# Patient Record
Sex: Female | Born: 1990 | Race: White | Hispanic: No | Marital: Single
Health system: Midwestern US, Academic
[De-identification: ages and names within clinical notes are randomized; demographics above are authoritative.]

## PROBLEM LIST (undated history)

## (undated) HISTORY — PX: WISDOM TOOTH EXTRACTION: SHX21

## (undated) HISTORY — PX: KNEE SURGERY: SHX244

---

## 2011-06-10 ENCOUNTER — Emergency Department (HOSPITAL_COMMUNITY)
Admission: EM | Admit: 2011-06-10 | Discharge: 2011-06-10 | Disposition: A | Discharge status: Home / Self Care | Payer: PRIVATE HEALTH INSURANCE | Attending: Emergency Medicine | Admitting: Emergency Medicine

## 2011-06-10 ENCOUNTER — Emergency Department (HOSPITAL_COMMUNITY): Payer: PRIVATE HEALTH INSURANCE

## 2011-06-10 ENCOUNTER — Encounter (HOSPITAL_COMMUNITY): Payer: Self-pay

## 2011-06-10 DIAGNOSIS — R1013 Epigastric pain: Secondary | ICD-10-CM | POA: Insufficient documentation

## 2011-06-10 DIAGNOSIS — R109 Unspecified abdominal pain: Secondary | ICD-10-CM

## 2011-06-10 DIAGNOSIS — R1011 Right upper quadrant pain: Secondary | ICD-10-CM | POA: Insufficient documentation

## 2011-06-10 LAB — URINALYSIS, ROUTINE W REFLEX MICROSCOPIC
Bilirubin Urine: NEGATIVE
Glucose, UA: NEGATIVE mg/dL
Hgb urine dipstick: NEGATIVE
Leukocytes, UA: NEGATIVE
Nitrite: NEGATIVE
Protein, ur: NEGATIVE mg/dL
Specific Gravity, Urine: 1.012 (ref 1.005–1.030)
Urobilinogen, UA: 0.2 mg/dL (ref 0.0–1.0)
pH: 6 (ref 5.0–8.0)

## 2011-06-10 LAB — PREGNANCY, URINE: Preg Test, Ur: NEGATIVE

## 2011-06-10 LAB — CBC
HCT: 40.6 % (ref 36.0–46.0)
Hemoglobin: 13.6 g/dL (ref 12.0–15.0)
MCH: 28.5 pg (ref 26.0–34.0)
MCHC: 33.5 g/dL (ref 30.0–36.0)
MCV: 84.9 fL (ref 78.0–100.0)
Platelets: 291 K/uL (ref 150–400)
RBC: 4.78 MIL/uL (ref 3.87–5.11)
RDW: 13.8 % (ref 11.5–15.5)
WBC: 7.5 K/uL (ref 4.0–10.5)

## 2011-06-10 LAB — COMPREHENSIVE METABOLIC PANEL
ALT: 11 U/L (ref 0–35)
Alkaline Phosphatase: 77 U/L (ref 39–117)
BUN: 12 mg/dL (ref 6–23)
CO2: 24 mEq/L (ref 19–32)
Chloride: 101 mEq/L (ref 96–112)
GFR calc Af Amer: >90 mL/min (ref >90)
GFR calc non Af Amer: >90 mL/min (ref >90)
Glucose, Bld: 89 mg/dL (ref 70–99)
Potassium: 3.7 mEq/L (ref 3.5–5.1)
Sodium: 136 mEq/L (ref 135–145)
Total Bilirubin: 0.3 mg/dL (ref 0.3–1.2)

## 2011-06-10 LAB — LIPASE, BLOOD: Lipase: 33 U/L (ref 11–59)

## 2011-06-10 MED ORDER — TRAMADOL HCL 50 MG PO TABS: 50.0000 mg | ORAL_TABLET | Freq: Four times a day (QID) | ORAL | Status: AC | PRN

## 2011-06-10 NOTE — Discharge Instructions (Signed)
As we discussed, your ultrasound was normal today. Your labs showed no concerning abnormalities.  [facility] should be able to refer you to an appropriate specialist for continued testing if your symptoms persist. If not, you can call the general surgery group in [place], called [facility] at [phone] to see if they would want to order the HIDA scan we discussed that may be helpful.  If your symptoms get worse/stop going away, are associated with fever, or are associated with vomiting, you should be seen again as this is a sign there may be an infection.     Abdominal Pain Abdominal pain can be caused by many things. Your caregiver decides the seriousness of your pain by an examination and possibly blood tests and X-rays. Many cases can be observed and treated at home. Most abdominal pain is not caused by a disease and will probably improve without treatment. However, in many cases, more time must pass before a clear cause of the pain can be found. Before that point, it may not be known if you need more testing, or if hospitalization or surgery is needed. HOME CARE INSTRUCTIONS   Do not take laxatives unless directed by your caregiver.   Take pain medicine only as directed by your caregiver.   Only take over-the-counter or prescription medicines for pain, discomfort, or fever as directed by your caregiver.   Try a clear liquid diet (broth, tea, or water) for as long as directed by your caregiver. Slowly move to a bland diet as tolerated.  SEEK IMMEDIATE MEDICAL CARE IF:   The pain does not go away.   You have a fever.   You keep throwing up (vomiting).   The pain is felt only in portions of the abdomen. Pain in the right side could possibly be appendicitis. In an adult, pain in the left lower portion of the abdomen could be colitis or diverticulitis.   You pass bloody or black tarry stools.  MAKE SURE YOU:   Understand these instructions.   Will watch your  condition.   Will get help right away if you are not doing well or get worse.  Document Released: 09/28/2004 Document Revised: 12/08/2010 Document Reviewed: 08/07/2007 [facility] Patient Information 2012 [place], [place].

## 2011-06-10 NOTE — ED Notes (Signed)
Pt in from home with abd pain states worsens after eating states pcp recommended her to come here for [place] of gallbladder states some nausea denies vomiting

## 2011-06-10 NOTE — ED Provider Notes (Signed)
History     CSN: [id number]  Arrival date & time 06/10/11  1313   First MD Initiated Contact with Patient 06/10/11 1332      Chief Complaint  Patient presents with  . Abdominal Pain    (Consider location/radiation/quality/duration/timing/severity/associated sxs/prior treatment) The history is provided by the patient.  21 y/o healthy female presents to ED with c/c of intermittent severe sharp non-radiating upper abd pain for the last 3 days. Pain typically begins 15-20 minutes after eating, is assoc with nausea but no vomiting, and resolves spontaneously within 1-2 hours. Pt endorses looser than normal but non-watery, non-bloody stools since the symptoms began. No assoc fever, CP, SOB. Symptoms worse with palpation of the area, going over bumps in the car, no alleviating factors. Was evaluated at [facility] and was advised to come to the ED for [place] evaluation of gallbladder, no tx attempted.  History reviewed. No pertinent past medical history.  History reviewed. No pertinent past surgical history.  No family history on file.  History  Substance Use Topics  . Smoking status: Never Smoker   . Smokeless tobacco: Not on file  . Alcohol Use: Yes     Review of Systems 10 systems reviewed and are negative for acute change except as noted in the HPI.  Allergies  Review of patient's allergies indicates no known allergies.  Home Medications  No current outpatient prescriptions on file.  BP 147/76  Pulse 68  Temp(Src) 97.6 F (36.4 C) (Oral)  Resp 18  SpO2 100%  LMP 05/28/2011  Physical Exam  Nursing note reviewed. Constitutional: She appears well-developed and well-nourished. No distress.       BP 147/76  Pulse 68  Temp(Src) 97.6 F (36.4 C) (Oral)  Resp 18  SpO2 100%  LMP 05/28/2011 VS reviewed and are sig for slight HTN. No tachycardia or fever.  HENT:  Head: Normocephalic and atraumatic.       MMM  Eyes: Conjunctivae are normal.  Neck: Neck supple.    Cardiovascular: Normal rate, regular rhythm and normal heart sounds.        Bilateral radial and DP pulses are 2+   Pulmonary/Chest: Effort normal and breath sounds normal. No respiratory distress. She has no wheezes.  Abdominal: Soft. Bowel sounds are normal. She exhibits no distension. There is tenderness in the right upper quadrant, epigastric area and left upper quadrant. There is no rigidity, no rebound, no guarding and negative Murphy's sign.  Musculoskeletal: She exhibits no edema and no tenderness.  Neurological: She is alert.  Skin: Skin is warm and dry. She is not diaphoretic.  Psychiatric: She has a normal mood and affect.    ED Course  Procedures (including critical care time)  Labs Reviewed  URINALYSIS, ROUTINE W REFLEX MICROSCOPIC - Abnormal; Notable for the following:    Ketones, ur TRACE (*)    All other components within normal limits  CBC  COMPREHENSIVE METABOLIC PANEL  LIPASE, BLOOD  PREGNANCY, URINE   US Abdomen Complete  06/10/2011  *RADIOLOGY REPORT*  Clinical Data:  Right upper quadrant and epigastric pain  ABDOMINAL ULTRASOUND COMPLETE  Comparison:  None.  Findings:  Gallbladder:  No gallstones, gallbladder wall thickening, or pericholecystic fluid.The sonographic Murphy's sign is reportedly negative, by the ultrasound technologist.  Common Bile Duct:  Within normal limits in caliber. Measures 3 mm.  Liver: No focal mass lesion identified.  Within normal limits in parenchymal echogenicity.  IVC:  Appears normal.  Pancreas:  No abnormality identified.  Spleen:  Within normal limits in size and echotexture. Measures 5 cm in length.  Right kidney:  Normal in size and parenchymal echogenicity.  No evidence of mass or hydronephrosis. Measures 12 cm in length.  Left kidney:  Normal in size and parenchymal echogenicity.  No evidence of mass or hydronephrosis. Measures 11.4 cm in length.  Abdominal Aorta:  No aneurysm identified. Maximal anterior to posterior diameter is 1.6  cm.  IMPRESSION: Negative abdominal ultrasound.  Original Report Authenticated By: Britta Mccreedy, M.D.     Dx 1: Abdominal pain   MDM  2:10 PM Pt has been seen and evaluated. Initial history and physical examination complete. No pain at rest on exam, mild upper abd TTP. No fever, emesis, or persistent severe pain- doubt cholecystitis or choledocholithiasis to suggest emergent surgery needed. As pt was advised by an outside provider to have an Korea in ED, we will proceed with this test. Basic abd labs also will be ordered. No signs of dehydration, no pain meds needed- IV not ordered. Will re-eval.      4:21 PM Labs reviewed, trace urinary ketones. Normal abd [place]. Discussed likelihood of biliary colic with pt and family, who voice understanding. Repeat abd exam benign. Will d.c home.  Shaaron Adler, PA-C 06/10/11 1623  Shaaron Adler, PA-C 06/10/11 [phone]

## 2011-06-12 NOTE — ED Provider Notes (Signed)
Medical screening examination/treatment/procedure(s) were performed by non-physician practitioner and as supervising physician I was immediately available for consultation/collaboration.   Gerhard Munch, MD 06/12/11 1239

## 2013-01-30 IMAGING — US US ABDOMEN COMPLETE
1 series · 14 of 25 positions shown · non-contrast
Comparison: None.

CLINICAL DATA: Right upper quadrant and epigastric pain

ABDOMINAL ULTRASOUND COMPLETE

[Series 1: us abdomen complete · 0.26mm/px · 14 of 67 slices shown]
[im 1/67]
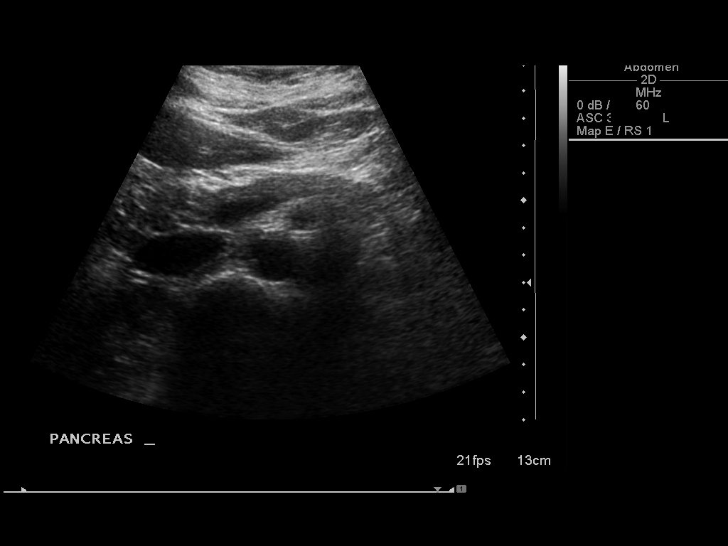
[im 6/67]
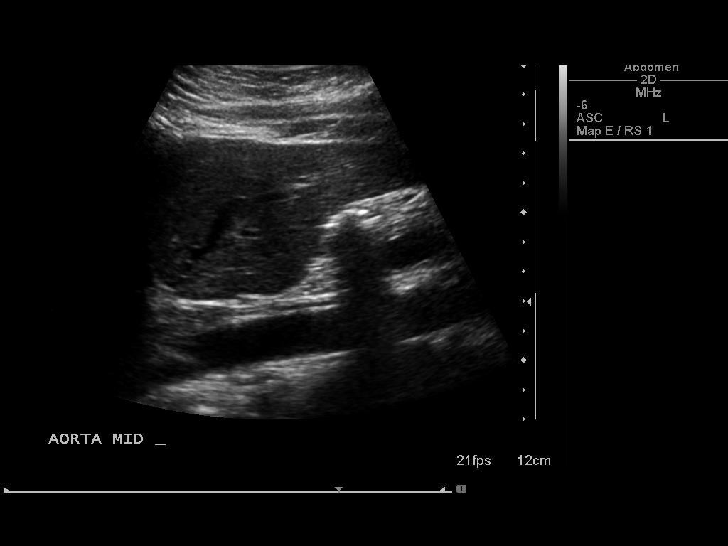
[im 12/67]
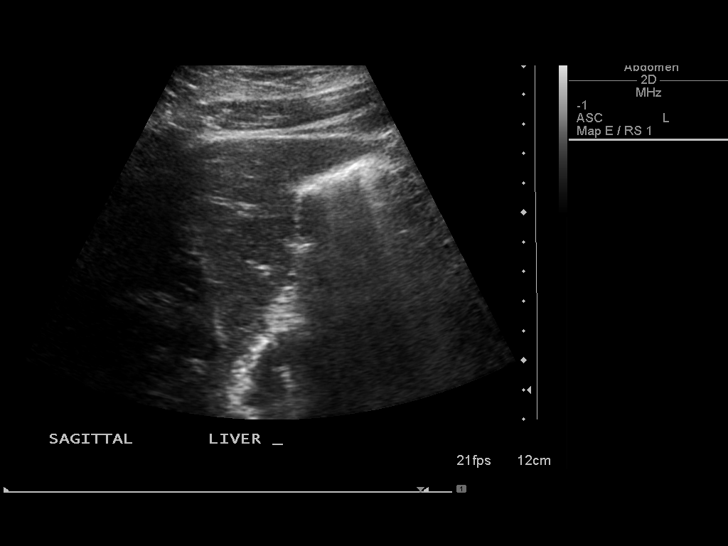
[im 17/67]
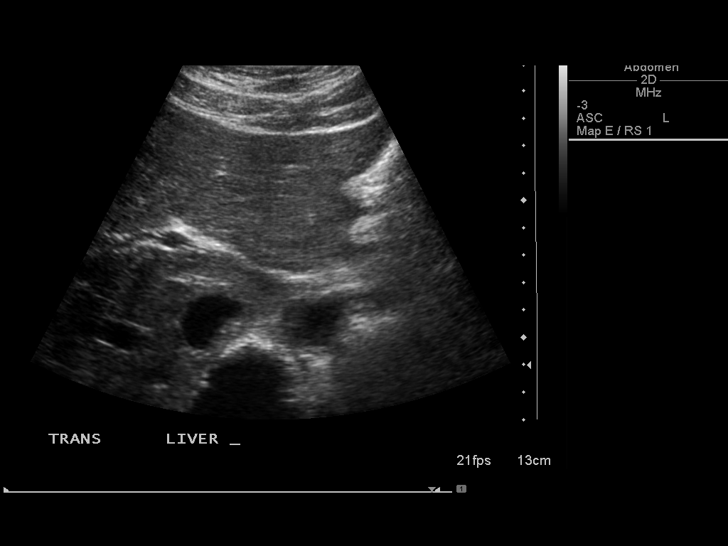
[im 23/67]
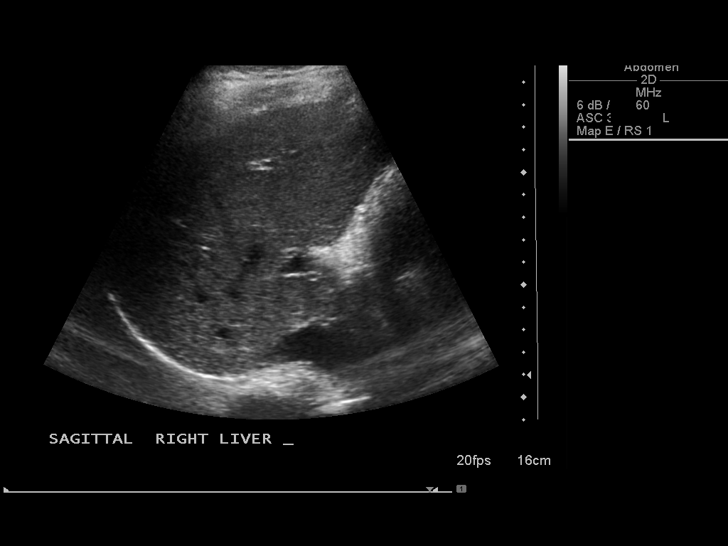
[im 25/67]
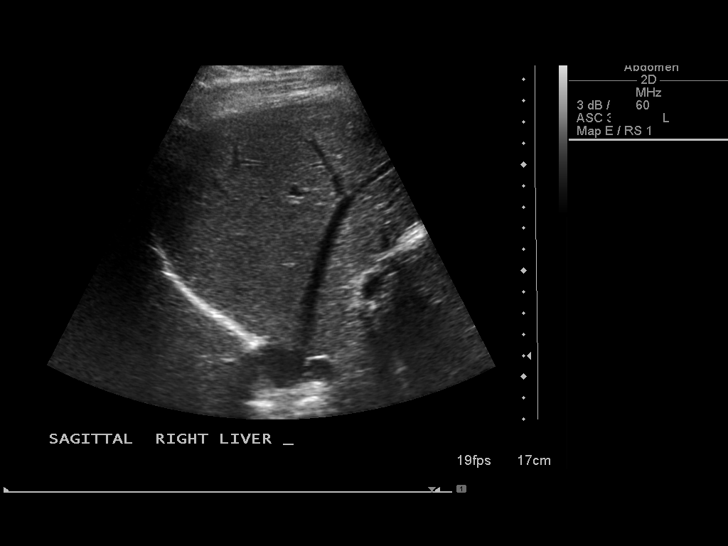
[im 31/67]
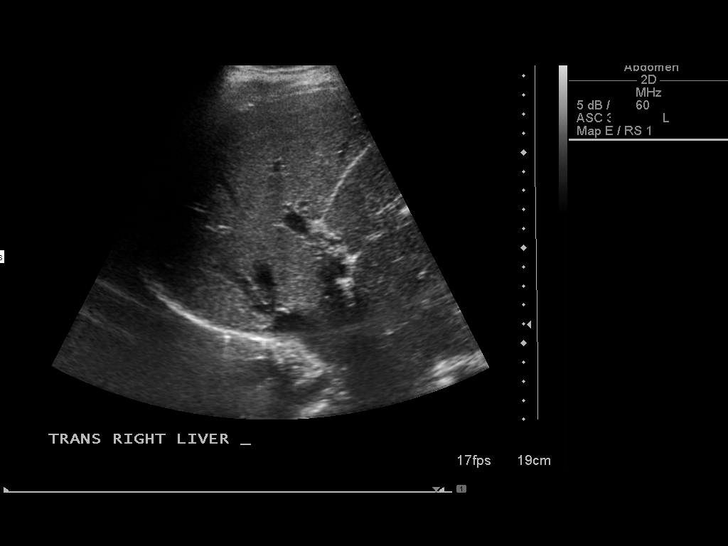
[im 36/67]
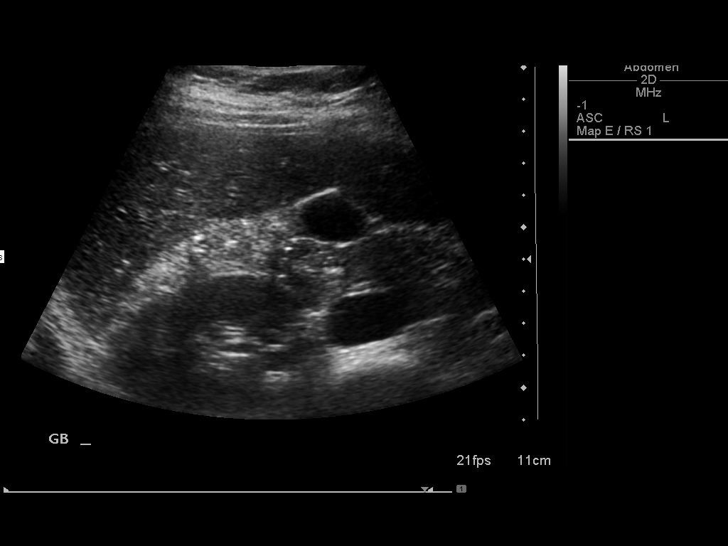
[im 42/67]
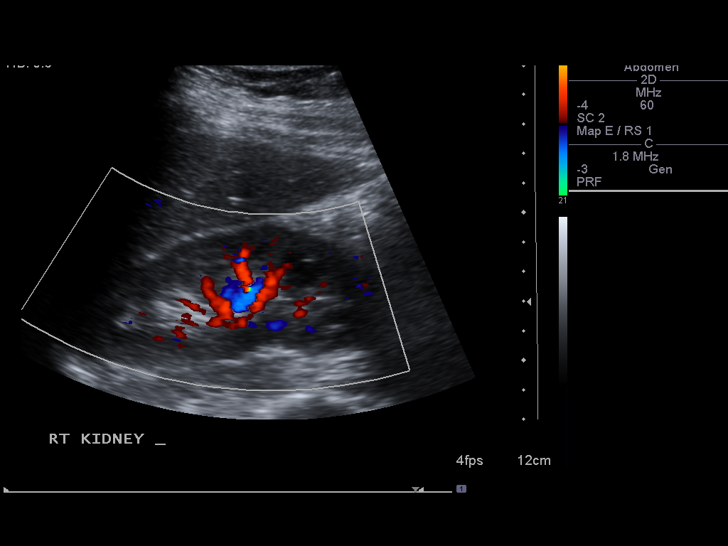
[im 45/67]
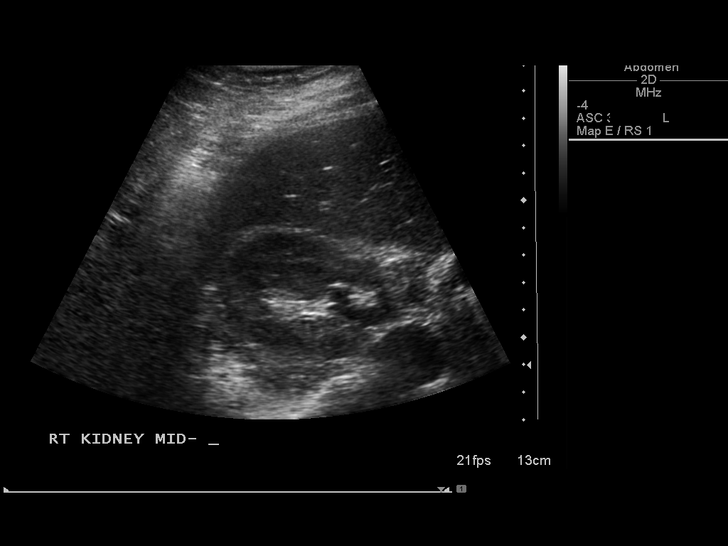
[im 50/67]
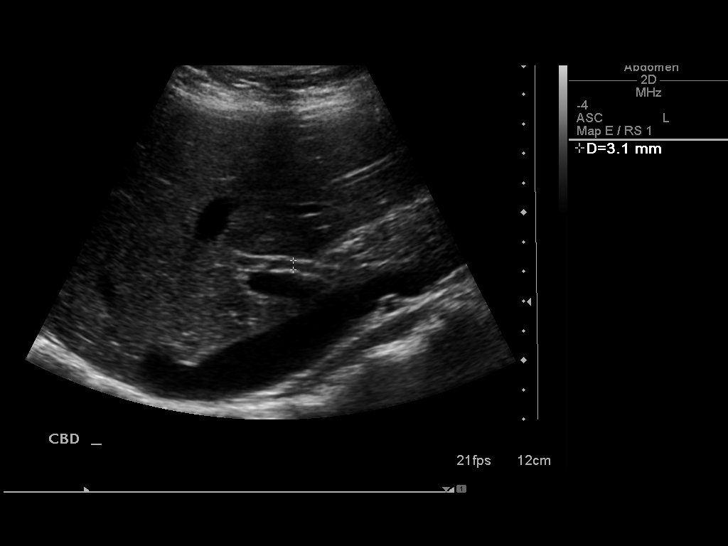
[im 56/67]
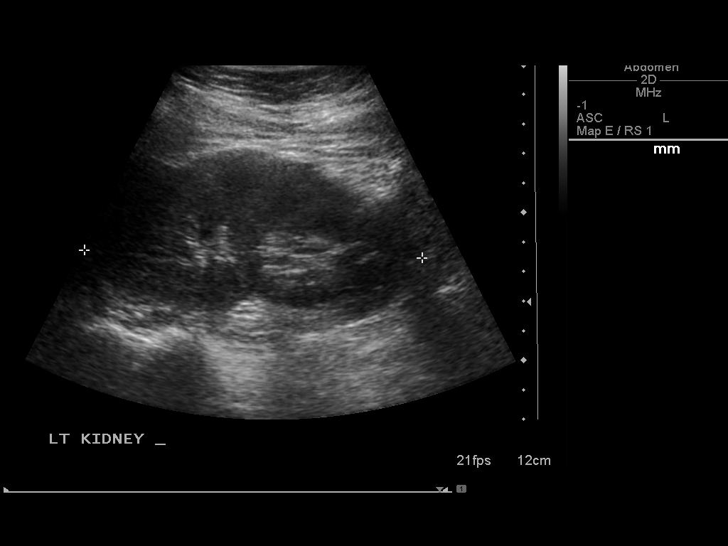
[im 61/67]
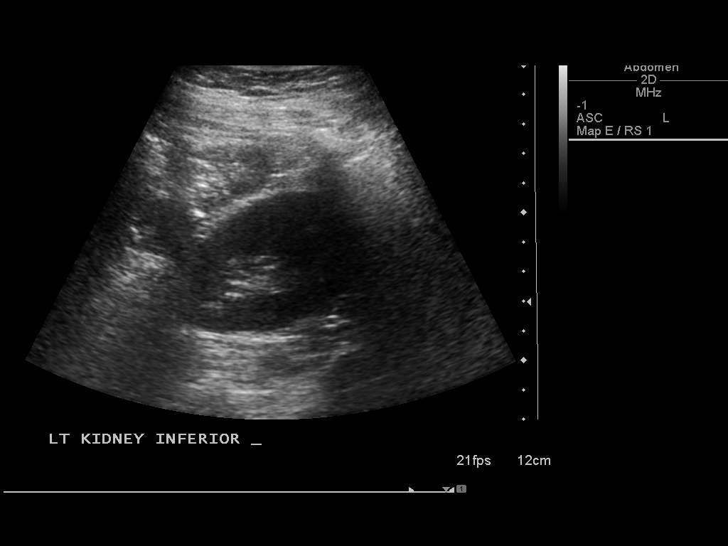
[im 67/67]
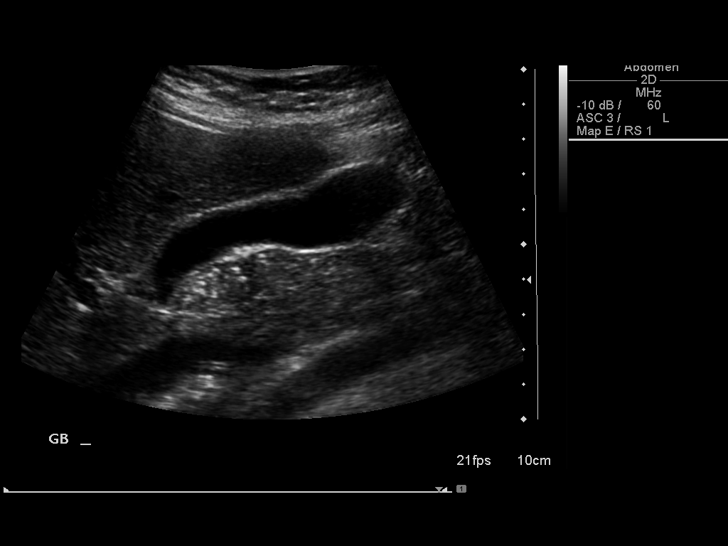

[14 of 25 positions shown; findings below may reference images not displayed]

FINDINGS: Gallbladder:  No gallstones, gallbladder wall thickening, or
pericholecystic fluid.The sonographic Murphy's sign is reportedly
negative, by the ultrasound technologist.

Common Bile Duct:  Within normal limits in caliber. Measures 3 mm.

Liver: No focal mass lesion identified.  Within normal limits in
parenchymal echogenicity.

IVC:  Appears normal.

Pancreas:  No abnormality identified.

Spleen:  Within normal limits in size and echotexture. Measures 5
cm in length.

Right kidney:  Normal in size and parenchymal echogenicity.  No
evidence of mass or hydronephrosis. Measures 12 cm in length.

Left kidney:  Normal in size and parenchymal echogenicity.  No
evidence of mass or hydronephrosis. Measures 11.4 cm in length.

Abdominal Aorta:  No aneurysm identified. Maximal anterior to
posterior diameter is 1.6 cm.
IMPRESSION: Negative abdominal ultrasound.

## 2013-07-16 ENCOUNTER — Emergency Department (HOSPITAL_COMMUNITY)
Admission: EM | Admit: 2013-07-16 | Discharge: 2013-07-16 | Disposition: A | Discharge status: Home / Self Care | Payer: BC Managed Care – PPO | Attending: Emergency Medicine | Admitting: Emergency Medicine

## 2013-07-16 ENCOUNTER — Encounter (HOSPITAL_COMMUNITY): Payer: Self-pay | Admitting: Emergency Medicine

## 2013-07-16 DIAGNOSIS — X58XXXA Exposure to other specified factors, initial encounter: Secondary | ICD-10-CM | POA: Insufficient documentation

## 2013-07-16 DIAGNOSIS — Y9389 Activity, other specified: Secondary | ICD-10-CM | POA: Insufficient documentation

## 2013-07-16 DIAGNOSIS — Y929 Unspecified place or not applicable: Secondary | ICD-10-CM | POA: Insufficient documentation

## 2013-07-16 DIAGNOSIS — S058X9A Other injuries of unspecified eye and orbit, initial encounter: Secondary | ICD-10-CM | POA: Insufficient documentation

## 2013-07-16 DIAGNOSIS — Z79899 Other long term (current) drug therapy: Secondary | ICD-10-CM | POA: Insufficient documentation

## 2013-07-16 DIAGNOSIS — H18821 Corneal disorder due to contact lens, right eye: Secondary | ICD-10-CM

## 2013-07-16 MED ORDER — CIPROFLOXACIN HCL 0.3 % OP SOLN: 2.0000 [drp] | OPHTHALMIC | Status: AC

## 2013-07-16 MED ORDER — IBUPROFEN 200 MG PO TABS
600.0000 mg | ORAL_TABLET | Freq: Once | ORAL | Status: AC
  Administered 2013-07-16: 600 mg via ORAL
  Filled 2013-07-16: qty 3

## 2013-07-16 MED ORDER — IBUPROFEN 800 MG PO TABS: 800.0000 mg | ORAL_TABLET | Freq: Three times a day (TID) | ORAL | Status: AC

## 2013-07-16 NOTE — ED Notes (Signed)
Pt states that after she removed her contacts tonight her eyes felt really dry; pt states that she put some eye drops in her right eye called Sustain Ultra; pt states that her rt eye immediately began to burn; pt states that she looked and the drops expired in 2013; pt c/o redness, burning and swelling to rt eye; pt is concerned about infection from expired eye drops

## 2013-07-16 NOTE — Discharge Instructions (Signed)
Corneal Abrasion °The cornea is the clear covering at the front and center of the eye. When you look at the colored portion of the eye, you are looking through the cornea. It is a thin tissue made up of layers. The top layer is the most sensitive layer. A corneal abrasion happens if this layer is scratched or an injury causes it to come off.  °HOME CARE °· You may be given drops or a medicated cream. Use the medicine as told by your doctor. °· A pressure patch may be put over the eye. If this is done, follow your doctor's instructions for when to remove the patch. Do not drive or use machines while the eye patch is on. Judging distances is hard to do with a patch on. °· See your doctor for a follow-up exam if you are told to do so. It is very important that you keep this appointment. °GET HELP IF:  °· You have pain, are sensitive to light, and have a scratchy feeling in one eye or both eyes. °· Your pressure patch keeps getting loose. You can blink your eye under the patch. °· You have fluid coming from your eye or the lids stick together in the morning. °· You have the same symptoms in the morning that you did with the first abrasion. This could be days, weeks, or months after the first abrasion healed. °MAKE SURE YOU:  °· Understand these instructions. °· Will watch your condition. °· Will get help right away if you are not doing well or get worse. °Document Released: 06/07/2007 Document Revised: 10/09/2012 Document Reviewed: 08/26/2012 °Exit[employer]® Patient Information ©2015 [employer]. This information is not intended to replace advice given to you by your health care provider. Make sure you discuss any questions you have with your health care provider. ° °

## 2013-07-16 NOTE — ED Provider Notes (Signed)
CSN: [id number]     Arrival date & time 07/16/13  0125 History   First MD Initiated Contact with Patient 07/16/13 0203     Chief Complaint  Patient presents with  . Eye Problem     (Consider location/radiation/quality/duration/timing/severity/associated sxs/prior Treatment) Patient is a 23 y.o. female presenting with eye problem. The history is provided by the patient.  Eye Problem Location:  R eye Quality:  Aching Severity:  Moderate Onset quality:  Sudden Timing:  Constant Progression:  Unchanged Chronicity:  New Context: not burn   Relieved by:  Nothing Exacerbated by: expired systane eye drops. Associated symptoms: redness and tearing   Associated symptoms: no double vision and no photophobia   Risk factors: no recent URI     History reviewed. No pertinent past medical history. Past Surgical History  Procedure Laterality Date  . Knee surgery    . Wisdom tooth extraction     No family history on file. History  Substance Use Topics  . Smoking status: Never Smoker   . Smokeless tobacco: Not on file  . Alcohol Use: Yes     Comment: occ   OB History   Grav Para Term Preterm Abortions TAB SAB Ect Mult Living                 Review of Systems  Eyes: Positive for redness. Negative for double vision and photophobia.  All other systems reviewed and are negative.     Allergies  Review of patient's allergies indicates no known allergies.  Home Medications   Prior to Admission medications   Medication Sig Start Date End Date Taking? Authorizing Provider  norgestimate-ethinyl estradiol (ORTHO-CYCLEN,SPRINTEC,PREVIFEM) 0.25-35 MG-MCG tablet Take 1 tablet by mouth daily.   Yes Historical Provider, MD  ciprofloxacin (CILOXAN) 0.3 % ophthalmic solution Place 2 drops into the right eye every 2 (two) hours while awake. Administer 1 drop, every 2 hours, while awake, for 2 days. Then 1 drop, every 4 hours, while awake, for the next 5 days. 07/16/13   Beau Ramsburg K Tyronne Blann-Rasch,  MD  ibuprofen (ADVIL,MOTRIN) 800 MG tablet Take 1 tablet (800 mg total) by mouth 3 (three) times daily. 07/16/13   Thurston Brendlinger K Kendrick Haapala-Rasch, MD   BP 147/88  Pulse 85  Temp(Src) 98.4 F (36.9 C) (Oral)  Resp 20  Ht 5\' 7"  (1.702 m)  Wt 165 lb (74.844 kg)  BMI 25.84 kg/m2  SpO2 100%  LMP 07/12/2013 Physical Exam  Constitutional: She is oriented to person, place, and time. She appears well-developed and well-nourished. No distress.  HENT:  Head: Normocephalic and atraumatic.  Mouth/Throat: Oropharynx is clear and moist.  Eyes: EOM are normal. Pupils are equal, round, and reactive to light. Right eye exhibits no chemosis and no exudate. Left eye exhibits no chemosis and no exudate. Right conjunctiva is injected.  Slit lamp exam:      The right eye shows corneal abrasion.    Neck: Normal range of motion. Neck supple.  Cardiovascular: Normal rate, regular rhythm and intact distal pulses.   Pulmonary/Chest: Effort normal and breath sounds normal. She has no wheezes. She has no rales.  Abdominal: Soft. Bowel sounds are normal. There is no tenderness.  Musculoskeletal: Normal range of motion.  Neurological: She is alert and oriented to person, place, and time.  Skin: Skin is warm and dry.  Psychiatric: She has a normal mood and affect.    ED Course  Procedures (including critical care time) Labs Review Labs Reviewed - No data to display  Imaging Review No results found.   EKG Interpretation None      MDM   Final diagnoses:  Corneal abrasion due to contact lens, right    ciloxan to the right eye.  Disguard contacts.  Glasses only x 7 days follow up with Dr. Randon Goldsmith (who is your regular eye care provider within 48 hours.      Jasmine Awe, MD 07/16/13 [phone]

## 2017-03-05 ENCOUNTER — Encounter

## 2019-12-11 ENCOUNTER — Ambulatory Visit: Admit: 2019-12-11 | Discharge: 2019-12-11 | Payer: PRIVATE HEALTH INSURANCE

## 2019-12-11 DIAGNOSIS — J358 Other chronic diseases of tonsils and adenoids: Secondary | ICD-10-CM

## 2019-12-11 NOTE — Unmapped (Signed)
Tonsil stones are mostly annoying and can cause pain on one or both side. You can try gargling and water pick as well. Options include do nothing or removing the tonsils. You would be a candidate for a tonsillotomy which I do using a laser, think of laser resurfacing.  It removes the crypts but not all the tonsil, the advantage is that it tends not to cause as much pain.  As opposed to the more common tonsillectomy which removes all the tissue and definitely causes a two weeks of a sore throat. Risks are otherwise similar, bleeding occurs in 4% of all tonsillectomy cases.

## 2019-12-11 NOTE — Progress Notes (Signed)
Referring Provider: Pcp, No  Date: 12/11/19      Subjective   History of Present Illness  Patient is a 29 y.o. who present with New Patient Visit/ Consultation and Sore Throat   since 2016 patient has had recurrent bacterial infections roughly every three years. 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 Overall last year was a better year from the infection standpoint however this year she just got another infection over Thanksgiving which required antibiotics and took several days off of school and work. She is here in interested in discussing a tonsillectomy again. 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          Laryngoscopy/Stroboscopy findings: defer    I performed all critical or key portions of the service and discussion of the case with the speech-language pathologist. I was present during the endoscopic examination to review diagnosis with patient and provide assessment and plan for treatment.    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 You can try gargling and water pick as well. Options include do nothing or removing the tonsils. You would be a candidate for a tonsillotomy which I do using a laser, think of laser resurfacing.  It removes the crypts but not all the tonsil, the advantage is that it tends not to cause as much pain.  As opposed to the more common tonsillectomy which removes all the tissue and definitely causes a two weeks of a sore throat. Risks are otherwise similar, bleeding occurs in 4% of all tonsillectomy cases.             Voice Intake Scores  12/11/2019   TOTAL SCORE VHI10: 0   TOTAL SCORE RSI: 0   TOTAL SCORE GFI: 0   TOTAL SCORE EAT10: 0        ENT Voice and Swallow Indexes VHI10 Total Score: (Total score <11 is [facility]) RSI Total Score: (Total score < 13 is WFL)  GFI Total Score: (Total score > 4 reflects problems in vocal function) EAT-10 Total Score: (Total score > 3 indicates difficulty swallowing) Dyspnea Index Total Score:   12/11/2019 0 0 0 0 0       Physical Exam:  Vitals  Pulse 71, resp. rate 16, height 5' 7 (1.702 m), weight 165 lb (74.8 kg), SpO2 99 %.    Constitutional: she is oriented to person, place, and time. she  appears well-developed.     Voice:normal   HENT:   Right Ear: External ear normal.   Left Ear: External ear normal.   OC/OP: Mucosa: normal Teeth:normal Tonsils:tonsils are present and normal  Eyes: Conjunctivae are normal. Pupils are equal, round, and reactive to light.   Neck:full range of motion Incision:absent  Pulmonary/Chest: Effort normal.   Musculoskeletal: Normal range of motion.   Neurological: she is alert and oriented to person, place, and time.   Skin: Skin is warm and dry.   Psychiatric: she has a normal mood and affect.        Cancer Staging  Cancer Staging  No matching staging information was found for the patient.  Histories  She has no past medical history on file.    She has no past surgical history on file.    Her family history is not on file.    She reports that she has never smoked. She has never used smokeless tobacco. She reports current alcohol use of about 3.0 - 4.0 standard drinks of alcohol per week. She reports that she does not use drugs.    Allergies  Patient has no known allergies.    Medications  No outpatient encounter medications on file as of 12/11/2019.     No facility-administered encounter medications on file as of 12/11/2019.        Review of Systems:  * See scanned Review of Systems sheet.    Lab Review:   No results found for: WBC, HGB, HCT, MCV, PLT, CREATININE, BUN, NA, K, CL, CO2, ALT, AST, GGT, ALKPHOS, BILITOT    Risks & Benefits:   Risks, benefits and treatment options discussed  with patient.    Medical Decision Making:  The following items were considered in medical decision making:  Permanent chart problem/surgery list reviewed  Permanent chart chronic medication/allergy list reviewed  Permanent chart social/family history reviewed  Review/order other diagnostic or treatment interventions  Review old records  Independent review of laryngoscopy/stroboscopy  Recommended surgery, discussed risks and benefits    Time spent:>35 Minutes

## 2020-01-22 NOTE — Unmapped (Signed)
Called pt Stephanie [name] advising that her surgery on 1/28 with Dr Providence Lanius time has moved up to 12:20 with her arrival time being 10:20 am

## 2020-01-22 NOTE — Unmapped (Signed)
01/08/20 0001   Pre-op Phone Call   Surgery Time Verified Yes  (01/30/20 @ 1540)   Arrival Time Verified 1340   Surgery Location Verified Yes  [facility])   Remind patient to bring picture ID and insurance card Yes   Medical History Reviewed Yes   NPO Status Reinforced Yes  (NPO 8 hours prior to arrival time)   Ride Caregiver Provider Instructed to arrange   Phone Number for Ride/Caregiver Instructed to arrange   Instructions to bring current medication list Yes   LVMM with Pre-Op instructions for patient. Reviewed fasting guidelines-NPO 8 hours prior to arrival time. Instructed to hold all vitamins and supplements except iron supplements from 01/23/20 until procedure. Avoid NSAIDS including ASA, Advil, Ibuprofen, Aleve, or Naproxen from 01/23/20 until procedure. Currtently not taking any medications according to Epic.  One visitor permitted due to covid.  Phone number given for patient to call with questions or concerns. Instructions also sent through MyChart.

## 2020-01-26 ENCOUNTER — Ambulatory Visit: Admit: 2020-01-26 | Payer: PRIVATE HEALTH INSURANCE

## 2020-01-26 ENCOUNTER — Institutional Professional Consult (permissible substitution): Admit: 2020-01-26 | Discharge: 2020-02-03 | Payer: PRIVATE HEALTH INSURANCE

## 2020-01-26 DIAGNOSIS — J3501 Chronic tonsillitis: Secondary | ICD-10-CM

## 2020-01-26 DIAGNOSIS — Z01818 Encounter for other preprocedural examination: Secondary | ICD-10-CM

## 2020-01-26 LAB — 2019 NOVEL CORONAVIRUS (COVID-19), NAA-B: SARS-CoV-2: NOT DETECTED

## 2020-01-26 NOTE — Unmapped (Signed)
Covid-19 nasopharyngeal specimen collected.

## 2020-01-29 NOTE — Unmapped (Signed)
Left message on voicemail to update arrival/OR time 0930/1130. Call back number left for questions or concerns.

## 2020-01-30 MED ORDER — midazolam (PF) (VERSED) injection
1 | INTRAMUSCULAR | Status: AC | PRN
Start: 2020-01-30 — End: 2020-01-30
  Administered 2020-01-30: 20:00:00 2 via INTRAVENOUS

## 2020-01-30 MED ORDER — HYDROmorphone (DILAUDID) 1 mg/mL injection Syrg
1 | INTRAMUSCULAR | Status: AC
Start: 2020-01-30 — End: ?

## 2020-01-30 MED ORDER — sodium chloride 0.9 % irrigation
0.9 | Status: AC | PRN
Start: 2020-01-30 — End: 2020-01-30
  Administered 2020-01-30: 20:00:00 500

## 2020-01-30 MED ORDER — acetaminophen (TYLENOL) tablet 975 mg
325 | ORAL | Status: AC | PRN
Start: 2020-01-30 — End: 2020-01-30
  Administered 2020-01-30: 15:00:00 975 mg via ORAL

## 2020-01-30 MED ORDER — fentaNYL (SUBLIMAZE) injection 12.5 mcg
50 | INTRAMUSCULAR | Status: AC | PRN
Start: 2020-01-30 — End: 2020-01-30

## 2020-01-30 MED ORDER — dexmedeTOMIDine (PRECEDEX) 100 mcg/mL injection
100 | INTRAVENOUS | Status: AC
Start: 2020-01-30 — End: ?

## 2020-01-30 MED ORDER — methylPREDNISolone (MEDROL DOSEPACK) 4 mg tablet
4 | ORAL | 0 refills | Status: AC
Start: 2020-01-30 — End: ?

## 2020-01-30 MED ORDER — HYDROmorphone (DILAUDID) injection Syrg 0.2 mg
0.5 | INTRAMUSCULAR | Status: AC | PRN
Start: 2020-01-30 — End: 2020-01-30

## 2020-01-30 MED ORDER — ondansetron (ZOFRAN) injection
4 | INTRAMUSCULAR | Status: AC | PRN
Start: 2020-01-30 — End: 2020-01-30
  Administered 2020-01-30: 20:00:00 4 via INTRAVENOUS

## 2020-01-30 MED ORDER — fentaNYL (SUBLIMAZE) injection 25 mcg
50 | INTRAMUSCULAR | Status: AC | PRN
Start: 2020-01-30 — End: 2020-01-30
  Administered 2020-01-30 (×2): 25 ug via INTRAVENOUS

## 2020-01-30 MED ORDER — lactated Ringers infusion
INTRAVENOUS | Status: AC
Start: 2020-01-30 — End: 2020-01-30
  Administered 2020-01-30 (×2): via INTRAVENOUS

## 2020-01-30 MED ORDER — HYDROmorphone (DILAUDID) injection Syrg
1 | INTRAMUSCULAR | Status: AC | PRN
Start: 2020-01-30 — End: 2020-01-30
  Administered 2020-01-30 (×2): .5 via INTRAVENOUS

## 2020-01-30 MED ORDER — oxyCODONE (ROXICODONE) 5 MG immediate release tablet
5 | ORAL_TABLET | Freq: Four times a day (QID) | ORAL | 0 refills | 6.00000 days | Status: AC | PRN
Start: 2020-01-30 — End: 2020-02-04

## 2020-01-30 MED ORDER — glucose chewable tablet 12 g
4 | ORAL | Status: AC | PRN
Start: 2020-01-30 — End: 2020-01-30

## 2020-01-30 MED ORDER — ibuprofen (MOTRIN) 200 MG tablet
200 | ORAL_TABLET | Freq: Three times a day (TID) | ORAL | 1 refills | Status: AC
Start: 2020-01-30 — End: ?

## 2020-01-30 MED ORDER — senna-docusate (SENNA-S) 8.6-50 mg per tablet
8.6-50 | ORAL_TABLET | Freq: Two times a day (BID) | ORAL | 0 refills | Status: AC | PRN
Start: 2020-01-30 — End: ?

## 2020-01-30 MED ORDER — fentaNYL (SUBLIMAZE) injection
50 | INTRAMUSCULAR | Status: AC | PRN
Start: 2020-01-30 — End: 2020-01-30
  Administered 2020-01-30 (×3): 25 via INTRAVENOUS

## 2020-01-30 MED ORDER — dexamethasone (DECADRON) injection 4 mg
4 | Freq: Once | INTRAMUSCULAR | Status: AC | PRN
Start: 2020-01-30 — End: 2020-01-30

## 2020-01-30 MED ORDER — acetaminophen (TYLENOL) 500 MG tablet
500 | ORAL_TABLET | Freq: Three times a day (TID) | ORAL | 1 refills | 11.00000 days | Status: AC
Start: 2020-01-30 — End: ?

## 2020-01-30 MED ORDER — glycopyrrolate (ROBINUL) injection
0.2 | INTRAMUSCULAR | Status: AC | PRN
Start: 2020-01-30 — End: 2020-01-30
  Administered 2020-01-30: 20:00:00 .2 via INTRAVENOUS

## 2020-01-30 MED ORDER — oxyCODONE (ROXICODONE) immediate release tablet 2.5 mg
5 | ORAL | Status: AC | PRN
Start: 2020-01-30 — End: 2020-01-30

## 2020-01-30 MED ORDER — HYDROmorphone (DILAUDID) injection Syrg 0.5 mg
0.5 | INTRAMUSCULAR | Status: AC | PRN
Start: 2020-01-30 — End: 2020-01-30

## 2020-01-30 MED ORDER — fentaNYL (SUBLIMAZE) 50 mcg/mL injection
50 | INTRAMUSCULAR | Status: AC
Start: 2020-01-30 — End: ?

## 2020-01-30 MED ORDER — dexmedeTOMIDine (PRECEDEX) 4 mcg/mL in sodium chloride 0.9 % 50 mL infusion
100 | INTRAVENOUS | Status: AC | PRN
Start: 2020-01-30 — End: 2020-01-30
  Administered 2020-01-30: 20:00:00 8 via INTRAVENOUS
  Administered 2020-01-30: 21:00:00 4 via INTRAVENOUS
  Administered 2020-01-30: 21:00:00 12 via INTRAVENOUS
  Administered 2020-01-30: 21:00:00 8 via INTRAVENOUS
  Administered 2020-01-30: 21:00:00 4 via INTRAVENOUS
  Administered 2020-01-30 (×2): 8 via INTRAVENOUS
  Administered 2020-01-30: 20:00:00 4 via INTRAVENOUS

## 2020-01-30 MED ORDER — sugammadex (BRIDION) IV solution
100 | INTRAVENOUS | Status: AC | PRN
Start: 2020-01-30 — End: 2020-01-30
  Administered 2020-01-30: 21:00:00 200 via INTRAVENOUS

## 2020-01-30 MED ORDER — propofol 10 mg/ml, 20mL vial (DIPRIVAN) INFUSION
10 | INTRAVENOUS | Status: AC | PRN
Start: 2020-01-30 — End: 2020-01-30
  Administered 2020-01-30: 20:00:00 150 via INTRAVENOUS

## 2020-01-30 MED ORDER — rocuronium (ZEMURON) injection
10 | INTRAVENOUS | Status: AC | PRN
Start: 2020-01-30 — End: 2020-01-30
  Administered 2020-01-30: 20:00:00 40 via INTRAVENOUS

## 2020-01-30 MED ORDER — ondansetron (ZOFRAN) injection 4 mg
4 | Freq: Three times a day (TID) | INTRAMUSCULAR | Status: AC | PRN
Start: 2020-01-30 — End: 2020-01-30

## 2020-01-30 MED ORDER — dexamethasone (DECADRON) injection
4 | INTRAMUSCULAR | Status: AC | PRN
Start: 2020-01-30 — End: 2020-01-30
  Administered 2020-01-30: 20:00:00 10 via INTRAVENOUS

## 2020-01-30 MED ORDER — dextrose 50 % in water (D50W) iv Syrg 25 mL
INTRAVENOUS | Status: AC | PRN
Start: 2020-01-30 — End: 2020-01-30

## 2020-01-30 MED ORDER — EPINEPHrine (ADRENALIN) 1 mg/mL injection
1 | INTRAMUSCULAR | Status: AC | PRN
Start: 2020-01-30 — End: 2020-01-30
  Administered 2020-01-30: 20:00:00 5 via SUBCUTANEOUS

## 2020-01-30 MED ORDER — propofol 10 mg/ml (DIPRIVAN) injection
10 | INTRAVENOUS | Status: AC | PRN
Start: 2020-01-30 — End: 2020-01-30
  Administered 2020-01-30: 20:00:00 200 via INTRAVENOUS
  Administered 2020-01-30: 21:00:00 50 via INTRAVENOUS

## 2020-01-30 MED ORDER — naloxone (NARCAN) injection 0.04 mg
0.4 | INTRAMUSCULAR | Status: AC | PRN
Start: 2020-01-30 — End: 2020-01-30

## 2020-01-30 MED ORDER — glycopyrrolate (ROBINUL) 0.2 mg/mL injection
0.2 | INTRAMUSCULAR | Status: AC
Start: 2020-01-30 — End: ?

## 2020-01-30 MED ORDER — dextrose 50 % in water (D50W) iv Syrg 50 mL
INTRAVENOUS | Status: AC | PRN
Start: 2020-01-30 — End: 2020-01-30

## 2020-01-30 MED ORDER — peppermint oiL liquid 1 mL
Status: AC | PRN
Start: 2020-01-30 — End: 2020-01-30

## 2020-01-30 MED ORDER — proMETHazine (PHENERGAN) injection 6.25 mg
25 | Freq: Four times a day (QID) | INTRAMUSCULAR | Status: AC | PRN
Start: 2020-01-30 — End: 2020-01-30

## 2020-01-30 MED ORDER — lidocainePF220mgmLSoln
20 | INTRAMUSCULAR | Status: AC | PRN
Start: 2020-01-30 — End: 2020-01-30
  Administered 2020-01-30: 20:00:00 80 via INTRAVENOUS

## 2020-01-30 MED ORDER — oxyCODONE (ROXICODONE) immediate release tablet 5 mg
5 | ORAL | Status: AC | PRN
Start: 2020-01-30 — End: 2020-01-30
  Administered 2020-01-30: 22:00:00 5 mg via ORAL

## 2020-01-30 MED ORDER — midazolam (PF) (VERSED) 1 mg/mL injection
1 | INTRAMUSCULAR | Status: AC
Start: 2020-01-30 — End: ?

## 2020-01-30 MED FILL — OXYCODONE 5 MG TABLET: 5 5 MG | ORAL | Qty: 1

## 2020-01-30 MED FILL — GLYCOPYRROLATE 0.2 MG/ML INJECTION SOLUTION: 0.2 0.2 mg/mL | INTRAMUSCULAR | Qty: 2

## 2020-01-30 MED FILL — DEXMEDETOMIDINE 100 MCG/ML INTRAVENOUS SOLUTION: 100 100 mcg/mL | INTRAVENOUS | Qty: 2

## 2020-01-30 MED FILL — HYDROMORPHONE 1 MG/ML INJECTION SYRINGE: 1 1 mg/mL | INTRAMUSCULAR | Qty: 1

## 2020-01-30 MED FILL — FENTANYL (PF) 50 MCG/ML INJECTION SOLUTION: 50 50 mcg/mL | INTRAMUSCULAR | Qty: 2

## 2020-01-30 MED FILL — TYLENOL 325 MG TABLET: 325 325 mg | ORAL | Qty: 3

## 2020-01-30 MED FILL — MIDAZOLAM (PF) 1 MG/ML INJECTION SOLUTION: 1 1 mg/mL | INTRAMUSCULAR | Qty: 2

## 2020-01-30 NOTE — Unmapped (Addendum)
[place]  DEPARTMENT OF ANESTHESIOLOGY  PRE-PROCEDURAL EVALUATION    Stephanie [name] is a 30 y.o. year old female presenting for:    Procedure(s):  MICROLARYNGOSCOPY with laser excision  Tonsillectomy ( greater than/equal 30 yo) consent laser tonsillotomy    Surgeon:   Lora Havens, MD    Chief Complaint     Chronic tonsillitis [J35.01]    Review of Systems     Anesthesia Evaluation    Patient summary reviewed and nursing notes reviewed.       No history of anesthetic complications   I have reviewed the History and Physical Exam, any relevant changes are noted in the anesthesia pre-operative evaluation.      Cardiovascular:    Exercise tolerance:  Duke Met score: 8 - Moving heavy furniture. Rapidly climbing stairs. Carrying 20 pounds up stairs.  (-) hypertension, past MI, cardiomyopathy, angina.    Neuro/Muscoloskeletal/Psych:      (-) seizures, neuromuscular disease, back problems, no anxiety, no bipolar disorder, no depression.     Pulmonary:      (-) asthma, recent URI, sleep apnea.       GI/Hepatic/Renal:      No bowel prep.  (-) GERD, hepatitis, liver disease, renal disease.    Endo/Other:        (-) hypothyroidism, no anemia, no bleeding disorder, no DVT, no steroid use.     Comments: Overweight BMI 27  Chronic tonsillitis No opiate use      Past Medical History     History reviewed. No pertinent past medical history.    Past Surgical History     Past Surgical History:   Procedure Laterality Date   ??? KNEE SURGERY     ??? WISDOM TOOTH EXTRACTION         Family History     History reviewed. No pertinent family history.    Social History     Social History     Socioeconomic History   ??? Marital status: Single     Spouse name: Not on file   ??? Number of children: Not on file   ??? Years of education: Not on file   ??? Highest education level: Not on file   Occupational History   ??? Not on file   Tobacco Use   ??? Smoking status: Never Smoker   ??? Smokeless tobacco: Never Used   Vaping Use   ??? Vaping Use: Never used    Substance and Sexual Activity   ??? Alcohol use: Yes     Alcohol/week: 3.0 - 4.0 standard drinks     Types: 3 - 4 Standard drinks or equivalent per week   ??? Drug use: Yes     Types: Marijuana     Comment: edibles on occassion   ??? Sexual activity: Not on file   Other Topics Concern   ??? Not on file   Social History Narrative   ??? Not on file     Social Determinants of Health     Financial Resource Strain: Not on file   Physical Activity: Not on file   Stress: Not on file   Social Connections: Not on file   Housing Stability: Not on file       Medications     Allergies:  No Known Allergies    Home Meds:  Prior to Admission medications as of 01/30/20 1016   Not on File       Inpatient Meds:  Scheduled:   Continuous:   ???  lactated Ringers         PRN: dextrose 50 % in water (D50W) **OR** dextrose 50 % in water (D50W), glucose    Vital Signs     Wt Readings from Last 3 Encounters:   01/30/20 175 lb (79.4 kg)   12/11/19 165 lb (74.8 kg)     Ht Readings from Last 3 Encounters:   01/30/20 5' 7 (1.702 m)   12/11/19 5' 7 (1.702 m)     Temp Readings from Last 3 Encounters:   01/30/20 98.5 ??F (36.9 ??C) (Temporal)     BP Readings from Last 3 Encounters:   01/30/20 141/74     Pulse Readings from Last 3 Encounters:   01/30/20 83   12/11/19 71     @LASTSAO2 (3)@    Physical Exam     Airway:     Mallampati: I  Mouth Opening: >2 FB  TM distance: > = 3 FB  Neck ROM: full    Dental:   - No obvious cracked, loose, chipped, or missing teeth.     Pulmonary:        (-) no rhonchi and no wheezes.    Cardiovascular:     Rhythm: regular  Rate: normal  (-) murmur and peripheral edema.    Neuro/Musculoskeletal/Psych:    Mental status: alert and oriented to person, place and time.          Abdominal:       Current OB Status:       Other Findings:        Laboratory Data     No results found for: WBC, HGB, HCT, MCV, PLT    No results found for: ABORH    No results found for: GLUCOSE, BUN, CO2, CREATININE, K, NA, CL, CALCIUM, ALBUMIN, PROT, ALKPHOS,  ALT, AST, BILITOT    No results found for: PTT, INR    No results found for: PREGTESTUR, PREGSERUM, HCG, HCGQUANT    Anesthesia Plan     ASA 2         Female and current non-smoker    Anesthesia Type:  general endotracheal.      PONV Risk Factors: female, current non-smoker,                  (PIV, PACU postop.  Airway per ENT.  IV opioids and tylenol for postop pain control.  )  Anesthetic plan and risks discussed with patient.    Plan, alternatives, and risks of anesthesia, including death, have been explained to and discussed with the patient/legal guardian.  By my assessment, the patient/legal guardian understands and agrees.  Scenario presented in detail.  Questions answered.    Use of blood products discussed with patient who consented to blood products.       Plan discussed with CRNA, attending and RNSA.

## 2020-01-30 NOTE — Unmapped (Signed)
Anesthesia Extubation Criteria:    Airway Device: endotracheal tube    Emergence Details:      Smooth      _x_      Stormy       __       Prolonged   __     Extubation Criteria:      Motor strength intact       _x_      Follows commands        _x_      Good airway reflexes      _x_      OP suctioned                  _x_        Follows commands:  Yes     Patient extubated:  Yes

## 2020-01-30 NOTE — Unmapped (Signed)
MICROLARYNGOSCOPY, laser excision Tonsillectomy ( greater than/equal 30 yo)  Brief Op Note  Stephanie [name]  01/30/2020      Pre-op Diagnosis: Chronic tonsillitis [J35.01]       Post-op Diagnosis: same    Procedure(s):  MICROLARYNGOSCOPY  laser excision Tonsillectomy ( greater than/equal 30 yo)      Surgeon(s):  Lora Havens, MD    Anesthesia: General    Staff:   Circulator: Rance Muir, RN; Galen Manila, RN  Scrub Person: Ok Anis, CST; Imagene Riches, CST  Resident: Hinton Rao, MD    Estimated Blood Loss: Minimal, less than 5cc                     There were no complications unless listed below.        Stephanie [name]     Date: 01/30/2020  Time: 3:47 PM

## 2020-01-30 NOTE — Unmapped (Signed)
Anesthesia Post Note    Patient: Stephanie [name]    Procedure(s) Performed: Procedure(s):  MICROLARYNGOSCOPY  laser excision Tonsillectomy ( greater than/equal 30 yo)    Anesthesia type: general endotracheal    Patient location: PACU    Airway: Patent    Post pain: Adequate analgesia    Nausea / Vomiting: Absent    Post-operative Hydration Status: Adequate    Post assessment: no apparent anesthetic complications and tolerated procedure well    Last Vitals:   Vitals:    01/30/20 1024 01/30/20 1602   BP: 141/74 106/45   BP Location:  Left arm   Patient Position:  Lying   Pulse: 83 73   Resp: 16 13   Temp: 98.5 ??F (36.9 ??C) 97.1 ??F (36.2 ??C)   TempSrc: Temporal Temporal   SpO2: 100% 98%   Weight: 175 lb (79.4 kg)    Height: 5' 7 (1.702 m)         Post vital signs: stable    Level of consciousness: awake    Complications:  There were no known complications for this encounter.

## 2020-01-30 NOTE — Unmapped (Signed)
INTRA-OP POST BRIEFING NOTE: Stephanie [name]      Specimens:     Prior to leaving the room: Nurse confirmed name of procedure, completion of instrument, sponge & needle counts, reads specimen labels aloud including patient name and addresses any equipment issues? Nurse confirmed wound class. Nurse to surgeon and anesthesia: What are key concerns for recovery and management of the patient?  Yes      Blood products stored at appropriate temperatures prior to return to blood bank (if applicable)? N/A      Patient identification band secured on patient prior to transfer out of the operating room? Yes    Temporary devices implanted for the duration of the surgery removed and evaluated for intactness and completeness prior to closure? N/A      Other Comments:     Signed: Rance Muir    Date: 01/30/2020    Time: 4:05 PM

## 2020-01-30 NOTE — Unmapped (Signed)
1. Activity:  ?? No lifting more than 5-10lbs or strenuous activity for 1-2 weeks  ?? May return to work as tolerated  2. Diet:  ?? Start with soft foods and advance to regular diet as tolerated.   3. Incision/wound care:   ?? Patient may shower/take a bath,  ?? You will have bad breath for up to 2 weeks.   4. Pain Management: No driving while taking narcotic pain medications. Take pain medications as directed or take ibuprofen or tylenol as instructed on package. Take the medrol dose pack as instructed starting the day after surgery.  5. Follow up:  Follow up as listed below or call [phone] to make an appointment.   6. Other instructions:     Please go to the Emergency Room or call our clinic if you experience any of the following:   ?? worsening pain, nausea, or vomiting not relieved by medications.   ?? Temperature greater than 102 F.   ?? Oral bleeding, if minimal (< 1 tsp) call office, for >1tsbp of blood go directly to ER.   ?? Chest pain, shortness of breath, persistent dizziness, swelling in one or both legs    Discharge Medications:     Medication List      TAKE these medications      PRN MORN NOON EVE BED   acetaminophen 500 MG tablet  Commonly known as: TYLENOL  Take 2 tablets (1,000 mg total) by mouth every 8 hours.                    ibuprofen 200 MG tablet  Commonly known as: MOTRIN  Take 2 tablets (400 mg total) by mouth every 8 hours.                    methylPREDNISolone 4 mg tablet  Commonly known as: MEDROL DOSEPACK  follow package directions                    oxyCODONE 5 MG immediate release tablet  Commonly known as: ROXICODONE  Take 1 tablet (5 mg total) by mouth every 6 hours as needed for Pain for up to 5 days.                    senna-docusate 8.6-50 mg per tablet  Commonly known as: SENNA-S  Take 1 tablet by mouth every 12 hours as needed for Constipation.                           Post Discharge Medical Supplies and Care Needs:     Appointments:  No follow-up provider specified.    Patient will  have follow up appointment in ENT clinic.    ?? Clinic is located at [facility] (4th floor), [address] [place], [place] [address]    Our clinic number is: [phone].  Please call with any questions or concerns.   Future Appointments   Date Time Provider Department Center   03/02/2020  3:00 PM Lora Havens, MD [facility]       Other Future Appointments (may need to be scheduled):        Additional Follow Up Actions:   PCP  Tora Kindred, MD  [address] [place] [phone]  [phone]

## 2020-01-30 NOTE — Unmapped (Signed)
Pt arrived to SDS from PACU. See flowsheet for assessment and reassessment.    Pt meets discharge criteria. Pt discharge instructions provided to pt and pt's mother, bedside. Prescriptions x 5 discussed, and pt to pick up from personal pharmacy. Pt and pt's mom with verbalized questions, questions answered per satisfaction. IV d/c'd. Pt and pt's mom left unit without incident.

## 2020-01-30 NOTE — Unmapped (Signed)
01/30/2020     Pre-Operative Diagnosis: chronic tonsillitis    Post-Operative Diagnosis: same    Procedure: CO2 laser excision bilateral tonsillectomy and suspension microlaryngoscopy [id number]    Attending: Lora Havens, MD    Assistant: ENT Resident: Talat    Anesthesia: general, LTA: 4% topical lidocaine    Indications: Stephanie [name] is a 30 y.o. patient with  I have recommended a CO2 laser tonsillotomy. ??We discussed the risks and benefits of surgery and the alternative which is to do nothing. ??Particular risks are sore gums, throat, oral bleeding up to 4% over two weeks, chipped teeth and also need for revision or no improvement at all in the symptoms. The patient has agreed to the above procedure and wishes to proceed with scheduling. ????    Procedure: Patient was brought into the operating room. Next, a surgical timeout was taken and the patient was identified by name, medical record number, date of birth, and procedure to be undertaken. The patient was secured to the operating room table, the right arm was secured with foam protecting the lateral arm. After a short amount of time, general anesthesia was induced. Next, with a maxillary dental guard in place a 4% laryngotracheal anesthesthetic was applied topically and with a 5-0 Rusch Laser ETT the patient was intubated by Dr Providence Lanius using the [facility] and suspension microlaryngoscopy with normal folds. ??    Laser safety precautions were taken-anesthesia decreased the inspired FiO2 < 30%, moist towels were used to protect the patient's face, all OR personnel wore laser-specific safety goggles and a laser certified technician was present throughout the procedure. ??     Next, the crowe davis mouth gag was placed into the oral cavity effectively suspending the oral tongue and floor of mouth.?? Next the Luminis CO2 laser and pharyngeal handpiece were used at 18W and circle of 1.5 depth of 3, repeat delay 0.1 to ablate the bilateral tonsils, leaving a capsule intact.?? There  was minimal bleeding easily controlled.???? The oral cavity was irrigated with sterile saline and an OG passed into the stomach to remove any residual stomach contents.    Finally, the patient was taken out of suspension, suctioned, returned to the anesthesia team, extubated, and finally returned to PACU in stable condition.??    Findings:??  1. [name] 1 exposure 5-0Rusch ETT??  2. Accublade CO2 laser tonsillotomy bilateral; no specimens

## 2020-01-30 NOTE — Unmapped (Signed)
Anesthesia Transfer of Care Note    Patient: Chloeanne Poteet  Procedure(s) Performed: Procedure(s):  MICROLARYNGOSCOPY  laser excision Tonsillectomy ( greater than/equal 30 yo)    Patient location: PACU    Anesthesia type: general endotracheal    Airway Device on Arrival to PACU/ICU: Nasal Cannula    IV Access: Peripheral    Monitors Recommended to be Used During PACU/ICU: Standard Monitors    Outstanding Issues to Address: None    Level of Consciousness: drowsy, but responds quick and  appropriately when speaking to pt    Post vital signs:    Vitals:    01/30/20 1602   BP: 106/45   Pulse: 73   Resp: 13   Temp: 97.1 ??F (36.2 ??C)   SpO2: 98%       Complications:  No complications documented.    Date 01/29/20 1500 - 01/30/20 0659(Not Admitted) 01/30/20 0700 - 01/31/20 0659   Shift 1500-2259 2300-0659 24 Hour Total 0700-1459 1500-2259 2300-0659 24 Hour Total   INTAKE   I.V.     1000(12.6)  1000(12.6)     Volume (mL) (lactated Ringers infusion)     1000  1000   Shift Total(mL/kg)     1000(12.6)  1000(12.6)   OUTPUT   Blood     25  25     Est Blood Loss     25  25   Shift Total(mL/kg)     25(0.3)  25(0.3)   Weight (kg)    79.4 79.4 79.4 79.4

## 2020-03-02 ENCOUNTER — Ambulatory Visit: Payer: PRIVATE HEALTH INSURANCE

## 2020-03-04 ENCOUNTER — Ambulatory Visit: Payer: PRIVATE HEALTH INSURANCE

## 2020-04-08 ENCOUNTER — Ambulatory Visit: Admit: 2020-04-08 | Discharge: 2020-04-08 | Payer: PRIVATE HEALTH INSURANCE

## 2020-04-08 DIAGNOSIS — J3501 Chronic tonsillitis: Secondary | ICD-10-CM

## 2020-04-08 NOTE — Unmapped (Signed)
04/08/2020 Postop: doing well, no complaints. White eschar after surgery resolved not issues since      Vitals:    04/08/20 1213   Resp: 16        Incision:well healed    Assessment    Status post laser tonsil     Plan    Return to office as needed
